# Patient Record
Sex: Male | Born: 1980 | Race: White | Hispanic: No | Marital: Married | Smoking: Never smoker
Health system: Southern US, Community
[De-identification: ages and names within clinical notes are randomized; demographics above are authoritative.]

## PROBLEM LIST (undated history)

## (undated) DIAGNOSIS — G4733 Obstructive sleep apnea (adult) (pediatric): Secondary | ICD-10-CM

## (undated) HISTORY — DX: Obstructive sleep apnea (adult) (pediatric): G47.33

---

## 2014-10-09 ENCOUNTER — Emergency Department (HOSPITAL_COMMUNITY)
Admission: EM | Admit: 2014-10-09 | Discharge: 2014-10-09 | Disposition: A | Discharge status: Home / Self Care | Attending: Emergency Medicine | Admitting: Emergency Medicine

## 2014-10-09 ENCOUNTER — Encounter (HOSPITAL_COMMUNITY): Payer: Self-pay

## 2014-10-09 DIAGNOSIS — R21 Rash and other nonspecific skin eruption: Secondary | ICD-10-CM

## 2014-10-09 MED ORDER — GOLD BOND EX POWD: Freq: Two times a day (BID) | CUTANEOUS | Status: DC

## 2014-10-09 MED ORDER — NYSTATIN-TRIAMCINOLONE 100000-0.1 UNIT/GM-% EX CREA: TOPICAL_CREAM | CUTANEOUS | Status: DC

## 2014-10-09 NOTE — ED Notes (Signed)
Pt c/o genital rash x 2 days. Denies pain, itching, and burning.  Denies discharge and GU complaints.  Pt reports a new sexual partner.

## 2014-10-09 NOTE — ED Provider Notes (Signed)
CSN: [id number]     Arrival date & time 10/09/14  1300 History   This chart was scribed for Trixie Dredge, PA-C working with Nelva Nay, MD by Elveria Rising, ED Scribe. This patient was seen in room WTR9/WTR9 and the patient's care was started at 2:25 PM.   Chief Complaint  Patient presents with  . Rash   The history is provided by the patient. No language interpreter was used.   HPI Comments: Warren Chapman is a 34 y.o. male who presents to the Emergency Department complaining of nonpruritic, nonpainful rash to suprapubic/groin which he noticed two days ago after showering. Patient suspects this is a "heat rash" describing it as similar to a rash he developed in [place]. Patient treated there with bacterial ointment; examiner contributed his symptoms to environmental irritants and compromised hygiene. Patient shares that he was prompted to be evaluated for this rash because it has not resolved. Patient reports treatment with cortisone cream, only with mild improvement. Patient denies new dermatological products or chemical exposures. Patient reports using lubricant, which he has used in the past. Patient denies burning, itching, penile discharge, testicular/scrotal pain or swelling.     History reviewed. No pertinent past medical history. History reviewed. No pertinent past surgical history. History reviewed. No pertinent family history. History  Substance Use Topics  . Smoking status: Never Smoker   . Smokeless tobacco: Not on file  . Alcohol Use: No    Review of Systems  Constitutional: Negative for fever and chills.  Gastrointestinal: Negative for abdominal pain.  Genitourinary: Negative for dysuria, discharge, penile swelling, scrotal swelling, penile pain and testicular pain.  Musculoskeletal: Negative for back pain.  Skin: Positive for rash.  Allergic/Immunologic: Negative for immunocompromised state.  Hematological: Does not bruise/bleed easily.  Psychiatric/Behavioral: Negative for  self-injury.    Allergies  Review of patient's allergies indicates no known allergies.  Home Medications   Prior to Admission medications   Not on File   Triage Vitals: BP 129/77 mmHg  Pulse 61  Temp(Src) 98.5 F (36.9 C) (Oral)  Resp 17  Ht  (1.778 m)  Wt 184 lb (83.462 kg)  BMI 26.40 kg/m2  SpO2 98% Physical Exam  Constitutional: He appears well-developed and well-nourished. No distress.  HENT:  Head: Normocephalic and atraumatic.  Neck: Neck supple.  Pulmonary/Chest: Effort normal.  Genitourinary:  Diffuse erythematous rash through suprapubic and inguinal region, and over penis. Testicles nontender, no edema, warmth or erythema. Penis circumcised. No discharge.   Neurological: He is alert.  Skin: He is not diaphoretic.  Nursing note and vitals reviewed.   ED Course  Procedures (including critical care time)  COORDINATION OF CARE: 2:34 PM- Discussed treatment plan with patient at bedside and patient agreed to plan.   Labs Review Labs Reviewed - No data to display  Imaging Review No results found.   EKG Interpretation None      MDM   Final diagnoses:  Groin rash    Afebrile, nontoxic patient with benign appearing rash of the entire groin area.  Clinically no c/w STD or cellulitis.   D/C home with gold bond, nystatin cream.  PCP follow up.  Discussed result, findings, treatment, and follow up  with patient.  Pt given return precautions.  Pt verbalizes understanding and agrees with plan.        I personally performed the services described in this documentation, which was scribed in my presence. The recorded information has been reviewed and is accurate.    Irving Burton  [place], PA-C 10/09/14 1819  Nelva Nayobert Beaton, MD 10/11/14 [phone]

## 2014-10-09 NOTE — Discharge Instructions (Signed)
Read the information below.  Use the prescribed medication as directed.  Please discuss all new medications with your pharmacist.  You may return to the Emergency Department at any time for worsening condition or any new symptoms that concern you.      Rash A rash is a change in the color or texture of your skin. There are many different types of rashes. You may have other problems that accompany your rash. CAUSES   Infections.  Allergic reactions. This can include allergies to pets or foods.  Certain medicines.  Exposure to certain chemicals, soaps, or cosmetics.  Heat.  Exposure to poisonous plants.  Tumors, both cancerous and noncancerous. SYMPTOMS   Redness.  Scaly skin.  Itchy skin.  Dry or cracked skin.  Bumps.  Blisters.  Pain. DIAGNOSIS  Your caregiver may do a physical exam to determine what type of rash you have. A skin sample (biopsy) may be taken and examined under a microscope. TREATMENT  Treatment depends on the type of rash you have. Your caregiver may prescribe certain medicines. For serious conditions, you may need to see a skin doctor (dermatologist). HOME CARE INSTRUCTIONS   Avoid the substance that caused your rash.  Do not scratch your rash. This can cause infection.  You may take cool baths to help stop itching.  Only take over-the-counter or prescription medicines as directed by your caregiver.  Keep all follow-up appointments as directed by your caregiver. SEEK IMMEDIATE MEDICAL CARE IF:  You have increasing pain, swelling, or redness.  You have a fever.  You have new or severe symptoms.  You have body aches, diarrhea, or vomiting.  Your rash is not better after 3 days. MAKE SURE YOU:  Understand these instructions.  Will watch your condition.  Will get help right away if you are not doing well or get worse. Document Released: 03/13/2002 Document Revised: 06/15/2011 Document Reviewed: 01/05/2011 [facility] Patient  Information 2015 [place], [place]. This information is not intended to replace advice given to you by your health care provider. Make sure you discuss any questions you have with your health care provider.    Emergency Department Resource Guide 1) Find a Doctor and Pay Out of Pocket Although you won't have to find out who is covered by your insurance plan, it is a good idea to ask around and get recommendations. You will then need to call the office and see if the doctor you have chosen will accept you as a new patient and what types of options they offer for patients who are self-pay. Some doctors offer discounts or will set up payment plans for their patients who do not have insurance, but you will need to ask so you aren't surprised when you get to your appointment.  2) Contact Your Local Health Department Not all health departments have doctors that can see patients for sick visits, but many do, so it is worth a call to see if yours does. If you don't know where your local health department is, you can check in your phone book. The CDC also has a tool to help you locate your state's health department, and many state websites also have listings of all of their local health departments.  3) Find a Walk-in Clinic If your illness is not likely to be very severe or complicated, you may want to try a walk in clinic. These are popping up all over the country in pharmacies, drugstores, and shopping centers. They're usually staffed by nurse practitioners or physician  assistants that have been trained to treat common illnesses and complaints. They're usually fairly quick and inexpensive. However, if you have serious medical issues or chronic medical problems, these are probably not your best option.  No Primary Care Doctor: - Call Health Connect at  [phone] - they can help you locate a primary care doctor that  accepts your insurance, provides certain services, etc. - Physician Referral Service-  [phone]  Chronic Pain Problems: Organization         Address  Phone   Notes  [facility]  [phone] Patients need to be referred by their primary care doctor.   Medication Assistance: Organization         Address  Phone   Notes  [facility] Medication [facility] [address] [place], [place] [address] [phone] --Must be a resident of [facility] [place] -- Must have NO insurance coverage whatsoever (no Medicaid/ Medicare, etc.) -- The pt. MUST have a primary care doctor that directs their care regularly and follows them in the community   MedAssist  [phone]) [phone]   [employer]  [phone]    Agencies that provide inexpensive medical care: Organization         Address  Phone   Notes  [facility]  [phone]   [facility]    [phone]   [facility] [address] [place], [place] [address] [phone]   [facility] [address], [place] [phone]   Planned Parenthood    [phone]   [facility]    [phone]   [facility]  [address], [place] Phone:  [phone], Fax:  [phone] Hours of Operation:  9 am - 6 pm, M-F.  Also accepts Medicaid/Medicare and self-pay.  [facility]  [address], [place] Phone: [phone]) [phone], Fax: [phone]. Hours of Operation:  8:30 am - 5:30 pm, M-F.  Also accepts Medicaid and self-pay.  [facility] [place] [address], [place] Phone: [phone]   [facility] [address], [place], [place] [phone], Ext. [phone] Mondays & Thursdays: 7-9 AM.  First 15 patients are seen on a first come, first serve basis.    Medicaid-accepting [facility] Providers:  Organization         Address  Phone   Notes  [facility] [address],  [place] [phone] Also accepts self-pay patients.  [facility] [address], [place]  [phone]   [facility] [address], [place] [phone]   [facility] [address] [place] [address], [place] [phone]   Renaye RakersVeita Bland [address], [place]   [phone] Only accepts [place] Access [place] patients after they have their name applied to their card.   Self-Pay (no insurance) in [facility]:  Organization         Address  Phone   Notes  Sickle Cell Patients, [facility] [address], [place] [phone]   [facility] [address], [place] [phone]   [facility]  [address],  [phone]   [facility]/Dr. Julio Sickssei-Bonsu  [address] [place]  [address], [place] or [address], [place] [phone] Phone number for both [place] and [place] locations is the same.  [facility] [address], [place] [phone]   [facility] [address], [place] or [address] [phone]   [facility] [address] [place] [phone], phone; [phone], fax Sees patients 1st and 3rd Saturday of every month.  Must not qualify for public or private insurance (i.e. Medicaid, Medicare, [place] Health Choice, Veterans' Benefits)  Household income should be no more than 200% of the poverty level The clinic cannot treat you if you are pregnant or think you are pregnant  Sexually transmitted diseases are not treated at the clinic.    Dental Care: Organization         Address  Phone  Notes  [facility] Department of [facility] [address] [place], [place] [phone] Accepts children up to age 38 who are enrolled in  [place] or [place] Health Choice; pregnant women with a Medicaid card; and children who have applied for Medicaid or [place] Health Choice, but were declined, whose parents can pay a reduced fee at time of service.  [facility] Department of [facility]  [address], [place] [phone] Accepts children up to age 10 who are enrolled in [place] or [place] Health Choice; pregnant women with a Medicaid card; and children who have applied for Medicaid or [place] Health Choice, but were declined, whose parents can pay a reduced fee at time of service.  [facility]  [address] [place], [place] [phone] Patients are seen by appointment only. Walk-ins are not accepted. [facility] will see patients 71 years of age and older. Monday - Tuesday (8am-5pm) Most Wednesdays (8:30-5pm) $30 per visit, cash only  [facility] Adult Dental Access PROGRAM  [address], [facility] [phone] Patients are seen by appointment only. Walk-ins are not accepted. [facility] will see patients 59 years of age and older. One Wednesday Evening (Monthly: Volunteer Based).  $30 per visit, cash only  [employer]  [phone] for adults; Children under age 65, call Graduate Pediatric Dentistry at [phone]. Children aged 9-14, please call [phone] to request a pediatric application.  Dental services are provided in all areas of dental care including fillings, crowns and bridges, complete and partial dentures, implants, gum treatment, root canals, and extractions. Preventive care is also provided. Treatment is provided to both adults and children. Patients are selected via a lottery and there is often a waiting list.   [facility] [address], [place]  [phone] [email or web address]   [facility] [address] [place], [place] [phone], Ext. [phone] Second and Fourth Thursday of each month, opens at 6:30  AM; Clinic ends at 9 AM.  Patients are seen on a first-come first-served basis, and a limited number are seen during each clinic.   [facility]  [address] [place], [place] [phone]   Eligibility Requirements You must have lived in [place], [place], or [place] counties for at least the last three months.   You cannot be eligible for state or federal sponsored [place], including [employer], [place], or [employer].   You generally cannot be eligible for healthcare insurance through your employer.    How to  apply: Eligibility screenings are held every Tuesday and Wednesday afternoon from 1:00 pm until 4:00 pm. You do not need an appointment for the interview!  [facility] [address], [place], [place] [phone]   [facility]  [phone]   [facility]  [phone]   [facility]  [phone]    Behavioral Health Resources in the Community: Intensive Outpatient Programs Organization         Address  Phone  Notes  [facility] [address], [place], [place] [phone]   [facility] [address], [place], [place] [phone]   [facility] [address], [place], [place]  [phone]   [facility] [address],  [place], [place] [phone] or [phone]   Substance Abuse Resources Organization         Address  Phone  Notes  [facility] Drug Services  [phone]   [facility]  [phone]   [facility]  [phone]   [facility]  [phone]   Residential & Outpatient Substance Abuse Program  [phone]   Psychological Services Organization         Address  Phone  Notes  [facility]  [phone]- [phone]   [facility]  [phone]- [phone]   [facility] [address], [place] [phone] or  [phone]    Mobile Crisis Teams Organization         Address  Phone  Notes  [facility], Mobile Crisis Care Unit  [phone]   [facility]  [address] [place], [place] [phone]   Doristine LocksSharon DeEsch [address] [place] [phone]    Self-Help/Support Groups Organization         Address  Phone             Notes  Mental Health Assoc. of  - variety of support groups  [phone] Call for more information  Narcotics Anonymous (NA), Caring [facility] [address], [place]  2 meetings at this location   Statisticianesidential Treatment Programs Organization         Address  Phone  Notes  [facility] [address],    [place]  [phone]   [facility]  [address], [place] [phone], [place], [place] [phone]   [facility] [address], [place] [phone] Admissions: 8am-3pm M-F  [facility] [address]    [place], [place] [phone]   [facility] [address], [place], [place] [phone]   [facility] [address]  [place], [place] [phone]   [facility] - Intensive Outpatient [address], [place], [place] [phone]   [facility] (Addiction Recovery [facility] [address]  [place], [place] [phone] or [phone]   [facility] (RTS) [address] [place], [place] [phone] Accepts Medicaid  [facility] [address]  [place] [phone] Substance Abuse/Addiction Treatment   [facility] Behavioral Health Resources Organization         Address  Phone  Notes  [facility]  [phone]   Angie FavaJulie Brannon, PhD [address], [address] [place], [place]   [phone](336) [phone] or [phone](336) [phone]   [facility]   [address] [place], [place] [phone]) [phone]   [facility] [address],  [place],  [place] [phone] Insurance/Medicaid/sponsorship through [facility] and Families [address], [address] [place], [place] [phone] [place], [place] [phone]    Dr. Adele Schilder  [phone]   [facility] 1) [address], [place] 2) [place] 3)  [place] [address], [place] [phone]  [phone]   [place] [phone] or [phone] (After Hours)

## 2019-03-09 ENCOUNTER — Other Ambulatory Visit: Payer: Self-pay

## 2019-03-09 DIAGNOSIS — Z20822 Contact with and (suspected) exposure to covid-19: Secondary | ICD-10-CM

## 2019-03-12 LAB — NOVEL CORONAVIRUS, NAA: SARS-CoV-2, NAA: NOT DETECTED

## 2020-04-15 ENCOUNTER — Other Ambulatory Visit: Payer: Self-pay

## 2020-04-15 ENCOUNTER — Encounter: Payer: Self-pay | Admitting: Internal Medicine

## 2020-04-15 ENCOUNTER — Ambulatory Visit: Payer: Managed Care, Other (non HMO) | Admitting: Internal Medicine

## 2020-04-15 VITALS — BP 114/70 | HR 65 | Temp 98.8°F | Ht 70.0 in | Wt 211.0 lb

## 2020-04-15 DIAGNOSIS — Z0001 Encounter for general adult medical examination with abnormal findings: Secondary | ICD-10-CM | POA: Insufficient documentation

## 2020-04-15 DIAGNOSIS — R590 Localized enlarged lymph nodes: Secondary | ICD-10-CM | POA: Insufficient documentation

## 2020-04-15 DIAGNOSIS — D696 Thrombocytopenia, unspecified: Secondary | ICD-10-CM | POA: Diagnosis not present

## 2020-04-15 DIAGNOSIS — R1011 Right upper quadrant pain: Secondary | ICD-10-CM

## 2020-04-15 LAB — HEPATIC FUNCTION PANEL
ALT: 53 U/L (ref 0–53)
AST: 22 U/L (ref 0–37)
Albumin: 4.5 g/dL (ref 3.5–5.2)
Alkaline Phosphatase: 74 U/L (ref 39–117)
Bilirubin, Direct: 0.2 mg/dL (ref 0.0–0.3)
Total Bilirubin: 0.7 mg/dL (ref 0.2–1.2)
Total Protein: 7.4 g/dL (ref 6.0–8.3)

## 2020-04-15 LAB — LIPID PANEL
Cholesterol: 131 mg/dL (ref 0–200)
HDL: 35 mg/dL — ABNORMAL LOW (ref >39.00)
LDL Cholesterol: 75 mg/dL (ref 0–99)
NonHDL: 95.91
Total CHOL/HDL Ratio: 4
Triglycerides: 104 mg/dL (ref 0.0–149.0)
VLDL: 20.8 mg/dL (ref 0.0–40.0)

## 2020-04-15 LAB — CBC WITH DIFFERENTIAL/PLATELET
Basophils Absolute: 0 10*3/uL (ref 0.0–0.1)
Basophils Relative: 0.2 % (ref 0.0–3.0)
Eosinophils Absolute: 0.1 10*3/uL (ref 0.0–0.7)
Eosinophils Relative: 2.8 % (ref 0.0–5.0)
HCT: 42.5 % (ref 39.0–52.0)
Hemoglobin: 14.7 g/dL (ref 13.0–17.0)
Lymphocytes Relative: 31.7 % (ref 12.0–46.0)
Lymphs Abs: 1.6 10*3/uL (ref 0.7–4.0)
MCHC: 34.6 g/dL (ref 30.0–36.0)
MCV: 87.4 fl (ref 78.0–100.0)
Monocytes Absolute: 0.5 10*3/uL (ref 0.1–1.0)
Monocytes Relative: 9.6 % (ref 3.0–12.0)
Neutro Abs: 2.8 10*3/uL (ref 1.4–7.7)
Neutrophils Relative %: 55.7 % (ref 43.0–77.0)
Platelets: 149 10*3/uL — ABNORMAL LOW (ref 150.0–400.0)
RBC: 4.86 Mil/uL (ref 4.22–5.81)
RDW: 13.2 % (ref 11.5–15.5)
WBC: 5 10*3/uL (ref 4.0–10.5)

## 2020-04-15 LAB — BASIC METABOLIC PANEL
BUN: 14 mg/dL (ref 6–23)
CO2: 31 mEq/L (ref 19–32)
Calcium: 9.4 mg/dL (ref 8.4–10.5)
Chloride: 105 mEq/L (ref 96–112)
Creatinine, Ser: 0.98 mg/dL (ref 0.40–1.50)
GFR: 97.9 mL/min (ref >60.00)
Glucose, Bld: 98 mg/dL (ref 70–99)
Potassium: 4.1 mEq/L (ref 3.5–5.1)
Sodium: 139 mEq/L (ref 135–145)

## 2020-04-15 LAB — URINALYSIS, ROUTINE W REFLEX MICROSCOPIC
Bilirubin Urine: NEGATIVE
Hgb urine dipstick: NEGATIVE
Ketones, ur: NEGATIVE
Leukocytes,Ua: NEGATIVE
Nitrite: NEGATIVE
RBC / HPF: NONE SEEN (ref 0–?)
Specific Gravity, Urine: 1.02 (ref 1.000–1.030)
Total Protein, Urine: NEGATIVE
Urine Glucose: NEGATIVE
Urobilinogen, UA: 0.2 (ref 0.0–1.0)
pH: 7 (ref 5.0–8.0)

## 2020-04-15 NOTE — Patient Instructions (Signed)
Lymphadenopathy  Lymphadenopathy means that your lymph glands are swollen or larger than normal. Lymph glands, also called lymph nodes, are collections of tissue that filter excess fluid, bacteria, viruses, and waste from your bloodstream. They are part of your body's disease-fighting system (immune system), which protects your body from germs. There may be different causes of lymphadenopathy, depending on where it is in your body. Some types go away on their own. Lymphadenopathy can occur anywhere that you have lymph glands, including these areas:  Neck (cervical lymphadenopathy).  Chest (mediastinal lymphadenopathy).  Lungs (hilar lymphadenopathy).  Underarms (axillary lymphadenopathy).  Groin (inguinal lymphadenopathy). When your immune system responds to germs, infection-fighting cells and fluid build up in your lymph glands. This causes some swelling and enlargement. If the lymph nodes do not go back to normal size after you have an infection or disease, your health care provider may do tests. These tests help to monitor your condition and find the reason why the glands are still swollen and enlarged. Follow these instructions at home:  Get plenty of rest.  Your health care provider may recommend over-the-counter medicines for pain. Take over-the-counter and prescription medicines only as told by your health care provider.  If directed, apply heat to swollen lymph glands as often as told by your health care provider. Use the heat source that your health care provider recommends, such as a moist heat pack or a heating pad. ? Place a towel between your skin and the heat source. ? Leave the heat on for 20-30 minutes. ? Remove the heat if your skin turns bright red. This is especially important if you are unable to feel pain, heat, or cold. You may have a greater risk of getting burned.  Check your affected lymph glands every day for changes. Check other lymph gland areas as told by your  health care provider. Check for changes such as: ? More swelling. ? Sudden increase in size. ? Redness or pain. ? Hardness.  Keep all follow-up visits. This is important.   Contact a health care provider if you have:  Lymph glands that: ? Are still swollen after 2 weeks. ? Have suddenly gotten bigger or the swelling spreads. ? Are red, painful, or hard.  Fluid leaking from the skin near an enlarged lymph gland.  Problems with breathing.  A fever, chills, or night sweats.  Fatigue.  A sore throat.  Pain in your abdomen.  Weight loss. Get help right away if you have:  Severe pain.  Chest pain.  Shortness of breath. These symptoms may represent a serious problem that is an emergency. Do not wait to see if the symptoms will go away. Get medical help right away. Call your local emergency services (911 in the [place] Do not drive yourself to the hospital. Summary  Lymphadenopathy means that your lymph glands are swollen or larger than normal.  Lymph glands, also called lymph nodes, are collections of tissue that filter excess fluid, bacteria, viruses, and waste from the bloodstream. They are part of your body's disease-fighting system (immune system).  Lymphadenopathy can occur anywhere that you have lymph glands.  If the lymph nodes do not go back to normal size after you have an infection or disease, your health care provider may do tests to monitor your condition and find the reason why the glands are still swollen and enlarged.  Check your affected lymph glands every day for changes. Check other lymph gland areas as told by your health care provider. This information   is not intended to replace advice given to you by your health care provider. Make sure you discuss any questions you have with your health care provider. Document Revised: 01/17/2020 Document Reviewed: 01/17/2020 Elsevier Patient Education  2021 Elsevier Inc.  

## 2020-04-15 NOTE — Progress Notes (Signed)
Subjective:  Patient ID: Warren Stanley., male    DOB: [birth year]  Age: 40 y.o. MRN: [id number]  CC: Abdominal Pain and Annual Exam  This visit occurred during the SARS-CoV-2 public health emergency.  Safety protocols were in place, including screening questions prior to the visit, additional usage of staff PPE, and extensive cleaning of exam room while observing appropriate contact time as indicated for disinfecting solutions.    HPI Warren Boney. presents for establishing and a CPX.  He complains of a 2-year history of intermittent, sharp right upper quadrant pain.  He controls the pain with Tylenol.  He has had significant exposures in the [employer].  He he was stationed in [place] and was exposed to burn pits.  He denies nausea, vomiting, fever, chills, loss of appetite, weight loss, dysuria, hematuria, odynophagia, or dysphagia.  History Warren Chapman has a past medical history of OSA on CPAP.   He has no past surgical history on file.   His family history includes Alcohol abuse in his father; Cervical cancer in his mother; Cirrhosis in his father.He reports that he has never smoked. He has never used smokeless tobacco. He reports that he does not drink alcohol and does not use drugs.  Outpatient Medications Prior to Visit  Medication Sig Dispense Refill  . Fexofenadine HCl (ALLEGRA ALLERGY PO) Take by mouth.    . menthol-zinc oxide (GOLD BOND) powder Apply topically 2 (two) times daily. 113 g 0  . nystatin-triamcinolone (MYCOLOG II) cream Apply to affected area daily 15 g 0   No facility-administered medications prior to visit.    ROS Review of Systems  Constitutional: Negative.  Negative for appetite change, chills, diaphoresis, fatigue and fever.  HENT: Negative.  Negative for trouble swallowing.   Eyes: Negative.   Respiratory: Negative for cough, chest tightness, shortness of breath and wheezing.   Cardiovascular: Negative for chest pain, palpitations and leg swelling.   Gastrointestinal: Positive for abdominal pain. Negative for blood in stool, constipation, diarrhea, nausea and vomiting.  Endocrine: Negative.   Genitourinary: Negative.  Negative for difficulty urinating, dysuria, hematuria, scrotal swelling and testicular pain.  Musculoskeletal: Negative for arthralgias and myalgias.  Skin: Negative.  Negative for color change and pallor.  Neurological: Negative.  Negative for dizziness, weakness and light-headedness.  Hematological: Negative for adenopathy. Does not bruise/bleed easily.  Psychiatric/Behavioral: Negative.     Objective:  BP 114/70   Pulse 65   Temp 98.8 F (37.1 C) (Oral)   Ht 5\' 10"  (1.778 m)   Wt 211 lb (95.7 kg)   SpO2 98%   BMI 30.28 kg/m   Physical Exam Vitals reviewed.  Constitutional:      Appearance: Normal appearance.  HENT:     Nose: Nose normal.     Mouth/Throat:     Mouth: Mucous membranes are moist.  Eyes:     General: No scleral icterus.    Conjunctiva/sclera: Conjunctivae normal.  Cardiovascular:     Rate and Rhythm: Normal rate and regular rhythm.     Heart sounds: No murmur heard.   Pulmonary:     Effort: Pulmonary effort is normal.     Breath sounds: No stridor. No wheezing, rhonchi or rales.  Chest:  Breasts:     Right: Axillary adenopathy present. No supraclavicular adenopathy.     Left: No axillary adenopathy or supraclavicular adenopathy.    Abdominal:     General: Abdomen is flat. Bowel sounds are normal. There is no distension.     Palpations:  Abdomen is soft. There is no hepatomegaly, splenomegaly or mass.     Tenderness: There is no abdominal tenderness.     Hernia: No hernia is present.  Musculoskeletal:        General: Normal range of motion.     Cervical back: Neck supple.     Right lower leg: No edema.     Left lower leg: No edema.  Lymphadenopathy:     Head:     Right side of head: No occipital adenopathy.     Left side of head: No occipital adenopathy.     Cervical: No  cervical adenopathy.     Right cervical: No superficial, deep or posterior cervical adenopathy.    Left cervical: No superficial, deep or posterior cervical adenopathy.     Upper Body:     Right upper body: Axillary adenopathy present. No supraclavicular, pectoral or epitrochlear adenopathy.     Left upper body: No supraclavicular, axillary, pectoral or epitrochlear adenopathy.     Lower Body: No right inguinal adenopathy. No left inguinal adenopathy.     Comments: There is a 2 cm, palpable, nontender, nonfixed lymph node in the right axillary region.  Skin:    General: Skin is warm and dry.     Findings: No rash.  Neurological:     General: No focal deficit present.     Mental Status: He is alert.  Psychiatric:        Mood and Affect: Mood normal.        Behavior: Behavior normal.     Lab Results  Component Value Date   WBC 5.0 04/15/2020   HGB 14.7 04/15/2020   HCT 42.5 04/15/2020   PLT 149.0 (L) 04/15/2020   GLUCOSE 98 04/15/2020   CHOL 131 04/15/2020   TRIG 104.0 04/15/2020   HDL 35.00 (L) 04/15/2020   LDLCALC 75 04/15/2020   ALT 53 04/15/2020   AST 22 04/15/2020   NA 139 04/15/2020   K 4.1 04/15/2020   CL 105 04/15/2020   CREATININE 0.98 04/15/2020   BUN 14 04/15/2020   CO2 31 04/15/2020    Assessment & Plan:   Warren Chapman was seen today for abdominal pain and annual exam.  Diagnoses and all orders for this visit:  LAD (lymphadenopathy), axillary -     CBC with Differential/Platelet; Future -     Basic metabolic panel; Future -     Urinalysis, Routine w reflex microscopic; Future -     Hepatic function panel; Future -     Hepatic function panel -     Urinalysis, Routine w reflex microscopic -     Basic metabolic panel -     CBC with Differential/Platelet -     CT Chest W Contrast; Future  Abdominal pain, right upper quadrant- He has had a history of significant exposure to toxins.  He has recurrent episodes of right upper quadrant abdominal pain, and he has  palpable lymphadenopathy in the right axillary region, he has a mildly low platelet count but his other labs are all within normal limits.  I recommended that he undergo a CT scan of the chest to see if there is concern for malignancy.  -     CBC with Differential/Platelet; Future -     Basic metabolic panel; Future -     Urinalysis, Routine w reflex microscopic; Future -     Hepatic function panel; Future -     Hepatic function panel -     Urinalysis,  Routine w reflex microscopic -     Basic metabolic panel -     CBC with Differential/Platelet -     CT Chest W Contrast; Future  Encounter for general adult medical examination with abnormal findings- Exam completed, labs reviewed, vaccines documented, patient education material was given. -     Lipid panel; Future -     Hepatitis C antibody; Future -     HIV Antibody (routine testing w rflx); Future -     HIV Antibody (routine testing w rflx) -     Hepatitis C antibody -     Lipid panel  Localized enlarged lymph nodes- See above. -     CT Chest W Contrast; Future  Thrombocytopenia (HCC)- See above.   I have discontinued Warren Stanley. "Warren Chapman"'s nystatin-triamcinolone and menthol-zinc oxide. I am also having him maintain his Fexofenadine HCl (ALLEGRA ALLERGY PO).  No orders of the defined types were placed in this encounter.    Follow-up: Return in about 4 weeks (around 05/13/2020).  Sanda Linger, MD

## 2020-04-16 LAB — HIV ANTIBODY (ROUTINE TESTING W REFLEX): HIV 1&2 Ab, 4th Generation: NONREACTIVE

## 2020-04-16 LAB — HEPATITIS C ANTIBODY
Hepatitis C Ab: NONREACTIVE
SIGNAL TO CUT-OFF: 0.36 (ref <1.00)

## 2020-04-17 ENCOUNTER — Encounter: Payer: Self-pay | Admitting: Internal Medicine

## 2020-04-30 ENCOUNTER — Telehealth: Payer: Self-pay | Admitting: Internal Medicine

## 2020-04-30 ENCOUNTER — Other Ambulatory Visit: Payer: Self-pay | Admitting: Internal Medicine

## 2020-04-30 DIAGNOSIS — R590 Localized enlarged lymph nodes: Secondary | ICD-10-CM

## 2020-04-30 DIAGNOSIS — D696 Thrombocytopenia, unspecified: Secondary | ICD-10-CM

## 2020-04-30 NOTE — Telephone Encounter (Signed)
Pt has been informed.

## 2020-04-30 NOTE — Telephone Encounter (Signed)
Spoke to pt and he asked about Dr. Yetta Barre wanting him to follow up on some lab work.  No labs have been put in.  Can you please call pt regarding this? Thanks

## 2020-04-30 NOTE — Telephone Encounter (Signed)
Labs ordered.

## 2020-05-03 ENCOUNTER — Other Ambulatory Visit: Payer: Self-pay | Admitting: Internal Medicine

## 2020-05-03 DIAGNOSIS — R590 Localized enlarged lymph nodes: Secondary | ICD-10-CM

## 2020-05-23 ENCOUNTER — Encounter: Payer: Self-pay | Admitting: Internal Medicine

## 2020-07-08 ENCOUNTER — Other Ambulatory Visit: Payer: Self-pay

## 2020-07-08 ENCOUNTER — Ambulatory Visit
Admission: RE | Admit: 2020-07-08 | Discharge: 2020-07-08 | Disposition: A | Discharge status: Home / Self Care | Payer: Managed Care, Other (non HMO) | Source: Ambulatory Visit | Attending: Internal Medicine | Admitting: Internal Medicine

## 2020-07-08 DIAGNOSIS — R590 Localized enlarged lymph nodes: Secondary | ICD-10-CM

## 2020-09-13 ENCOUNTER — Encounter: Payer: Self-pay | Admitting: Internal Medicine

## 2020-09-16 ENCOUNTER — Telehealth (INDEPENDENT_AMBULATORY_CARE_PROVIDER_SITE_OTHER): Payer: Managed Care, Other (non HMO) | Admitting: Internal Medicine

## 2020-09-16 ENCOUNTER — Encounter: Payer: Self-pay | Admitting: Internal Medicine

## 2020-09-16 DIAGNOSIS — J069 Acute upper respiratory infection, unspecified: Secondary | ICD-10-CM | POA: Diagnosis not present

## 2020-09-16 NOTE — Assessment & Plan Note (Signed)
Do a COVID test today A work note for  6/10, 09/14/20 Use OTC cold meds prn

## 2020-09-16 NOTE — Progress Notes (Signed)
Virtual Visit via Video Note  I connected with Warren Chapman. on 09/16/20 at  3:20 PM EDT by a video enabled telemedicine application and verified that I am speaking with the correct person using two identifiers.  Location: Patient: home Provider: [facility] office No one else is present   I discussed the limitations of evaluation and management by telemedicine and the availability of in person appointments. The patient expressed understanding and agreed to proceed.  History of Present Illness:   C/o cold like sx's since Thur-Fri: congestion, fatigue, achy. Taking OTC cold meds - feeling better...  Observations/Objective:  Pt sounds normal, NAD. Assessment and Plan:   Follow Up Instructions:    I discussed the assessment and treatment plan with the patient. The patient was provided an opportunity to ask questions and all were answered. The patient agreed with the plan and demonstrated an understanding of the instructions.   The patient was advised to call back or seek an in-person evaluation if the symptoms worsen or if the condition fails to improve as anticipated.  I provided 12 minutes of non-face-to-face time during this encounter.   Sonda Primes, MD

## 2021-02-03 ENCOUNTER — Ambulatory Visit (INDEPENDENT_AMBULATORY_CARE_PROVIDER_SITE_OTHER): Payer: Managed Care, Other (non HMO)

## 2021-02-03 ENCOUNTER — Other Ambulatory Visit: Payer: Self-pay

## 2021-02-03 DIAGNOSIS — Z23 Encounter for immunization: Secondary | ICD-10-CM | POA: Diagnosis not present

## 2021-02-03 NOTE — Progress Notes (Signed)
Pt was given reg flu vacc w/o any complications. 

## 2021-02-06 NOTE — Progress Notes (Signed)
    Subjective:    CC: R shoulder pain  I, Molly Weber, LAT, ATC, am serving as scribe for Dr. Clementeen Graham.  HPI: Pt is a 40 y/o male presenting w/ c/o R shoulder pain x several months w/ no known MOI.  He locates his pain to his R lateral shoulder.  He works in [employer] and is in the [employer].  He does recall his R shoulder "giving out" in 2015 when doing push-ups during a PT test but otherwise recalls no specific R shoulder injury.  Radiating pain: no R shoulder mechanical symptoms: intermittently  Aggravating factors: throwing a baseball or football; lifting something heavy overhead;  Treatments tried: Tylenol arthritis    Pertinent review of Systems: no fever or chills  Relevant historical information: thrombocytopenia hx   Objective:    Vitals:   02/07/21 0759  BP: 110/80  Pulse: 68  SpO2: 98%   General: Well Developed, well nourished, and in no acute distress.   MSK: Right shoulder: Normal-appearing Normal motion. Normal strength. Mildly positive Hawkins and Neer's test. Mildly positive empty can test. Negative Yergason's and speeds test. Negative O'Brien's test. Slight palpable click with abduction and external rotation. Negative clunk test.   Lab and Radiology Results  Diagnostic Limited MSK Ultrasound of: Right shoulder Biceps tendon intact and normal-appearing Subscapularis tendon normal-appearing Supraspinatus tendon intact without visible tear. Mild subacromial tightness present. Infraspinatus tendon normal. AC joint normal-appearing Impression: Subacromial bursitis mild  X-ray images right shoulder obtained today personally and independently interpreted No acute fractures.  Normal-appearing shoulder Await formal radiology review   Impression and Recommendations:    Assessment and Plan: 40 y.o. male with right shoulder pain present with overhead heavy lifting and with dynamic activities such as overhead throwing.  Pain thought to  be due to rotator cuff impingement with some bursitis and may be rotator cuff tendinopathy.  Discussed treatment plan and options..  Plan to treat with home exercise program taught in clinic today by ATC.  Patient would do better with formal physical therapy but he would like to try it on his own first.  Check back in 6 weeks.  If not improved consider PT, injection, or MRI.  PDMP not reviewed this encounter. Orders Placed This Encounter  Procedures   [place] LIMITED JOINT SPACE STRUCTURES UP RIGHT(NO LINKED CHARGES)    Order Specific Question:   Reason for Exam (SYMPTOM  OR DIAGNOSIS REQUIRED)    Answer:   R shoulder pain    Order Specific Question:   Preferred imaging location?    Answer:   [facility] Right    Standing Status:   Future    Number of Occurrences:   1    Standing Expiration Date:   03/08/2021    Order Specific Question:   Reason for Exam (SYMPTOM  OR DIAGNOSIS REQUIRED)    Answer:   R shoulder pain    Order Specific Question:   Preferred imaging location?    Answer:   [facility]   No orders of the defined types were placed in this encounter.   Discussed warning signs or symptoms. Please see discharge instructions. Patient expresses understanding.   The above documentation has been reviewed and is accurate and complete Clementeen Graham, M.D.

## 2021-02-07 ENCOUNTER — Other Ambulatory Visit: Payer: Self-pay

## 2021-02-07 ENCOUNTER — Ambulatory Visit: Payer: Managed Care, Other (non HMO) | Admitting: Family Medicine

## 2021-02-07 ENCOUNTER — Ambulatory Visit: Payer: Self-pay

## 2021-02-07 ENCOUNTER — Ambulatory Visit (INDEPENDENT_AMBULATORY_CARE_PROVIDER_SITE_OTHER): Payer: Managed Care, Other (non HMO)

## 2021-02-07 ENCOUNTER — Encounter: Payer: Self-pay | Admitting: Family Medicine

## 2021-02-07 VITALS — BP 110/80 | HR 68 | Ht 70.0 in | Wt 208.8 lb

## 2021-02-07 DIAGNOSIS — G8929 Other chronic pain: Secondary | ICD-10-CM

## 2021-02-07 DIAGNOSIS — M25511 Pain in right shoulder: Secondary | ICD-10-CM | POA: Diagnosis not present

## 2021-02-07 NOTE — Patient Instructions (Addendum)
Nice to meet you today.  Please perform the exercise program that we have prepared for you and gone over in detail on a daily basis.  In addition to the handout you were provided you can access your program through: [email or web address]   Your unique program code is:   [email or web address]  Follow-up: 6 weeks

## 2021-02-08 NOTE — Progress Notes (Signed)
Right shoulder x-ray looks normal to radiology

## 2021-03-19 NOTE — Progress Notes (Signed)
° °  I, Christoper Fabian, LAT, ATC, am serving as scribe for Dr. Clementeen Graham.  Warren Chapman. is a 40 y.o. male who presents to [facility] today for f/u of R lateral shoulder pain  thought to be due to rotator cuff impingement with some bursitis.  He was last seen by Dr. Denyse Amass on 02/07/21 and was provided a HEP focusing on RC and periscapular strengthening.  Today, pt reports that his L shoulder isn't hurting currently.  He has been lifting but has not been doing his HEP.  He feels better.   Diagnostic testing: R shoulder XR- 02/07/21  Pertinent review of systems: No fevers or chills  Relevant historical information: Thrombocytopenia   Exam:  BP 102/72 (BP Location: Left Arm, Patient Position: Sitting, Cuff Size: Normal)    Pulse 64    Ht 5\' 10"  (1.778 m)    Wt 214 lb (97.1 kg)    SpO2 97%    BMI 30.71 kg/m  General: Well Developed, well nourished, and in no acute distress.   MSK: Right shoulder normal-appearing normal motion normal strength negative impingement testing.    Lab and Radiology Results EXAM: RIGHT SHOULDER - 2+ VIEW   COMPARISON:  None.   FINDINGS: There is no evidence of fracture or dislocation. There is no evidence of arthropathy or other focal bone abnormality. Soft tissues are unremarkable.   IMPRESSION: No radiographic abnormality is seen in right shoulder.     Electronically Signed   By: M.D.   On: 02/07/2021 15:54 I, 13/07/2020, personally (independently) visualized and performed the interpretation of the images attached in this note.     Assessment and Plan: 40 y.o. male with right shoulder pain due to impingement.  Significant improvement with home exercise program.  Plan to continue home exercise program and recheck back as needed.  Certainly if symptoms return can order physical therapy or return to clinic for injection or even MRI.  Check back as needed   Discussed warning signs or symptoms.  Please see discharge instructions. Patient expresses understanding.   The above documentation has been reviewed and is accurate and complete 24, M.D.

## 2021-03-20 ENCOUNTER — Other Ambulatory Visit: Payer: Self-pay

## 2021-03-20 ENCOUNTER — Encounter: Payer: Self-pay | Admitting: Family Medicine

## 2021-03-20 ENCOUNTER — Ambulatory Visit (INDEPENDENT_AMBULATORY_CARE_PROVIDER_SITE_OTHER): Payer: Managed Care, Other (non HMO) | Admitting: Family Medicine

## 2021-03-20 VITALS — BP 102/72 | HR 64 | Ht 70.0 in | Wt 214.0 lb

## 2021-03-20 DIAGNOSIS — G8929 Other chronic pain: Secondary | ICD-10-CM

## 2021-03-20 DIAGNOSIS — M25511 Pain in right shoulder: Secondary | ICD-10-CM | POA: Diagnosis not present

## 2021-03-20 NOTE — Patient Instructions (Signed)
Good to see you today.  Follow-up as needed.  Happy Holidays! 

## 2022-12-27 IMAGING — US US AXILLARY RIGHT
1 series · 5 of 5 positions shown · non-contrast
Comparison: None.

CLINICAL DATA: 39-year-old male presenting with a possible palpable
lump in the RIGHT axilla on clinical examination. The patient had
his DAINR-WE booster in the RIGHT arm in April 2020.

EXAM:
ULTRASOUND OF THE RIGHT AXILLA

[Series 1: us axillary right · 0.07mm/px · 5 of 5 slices shown]
[im 1/5]
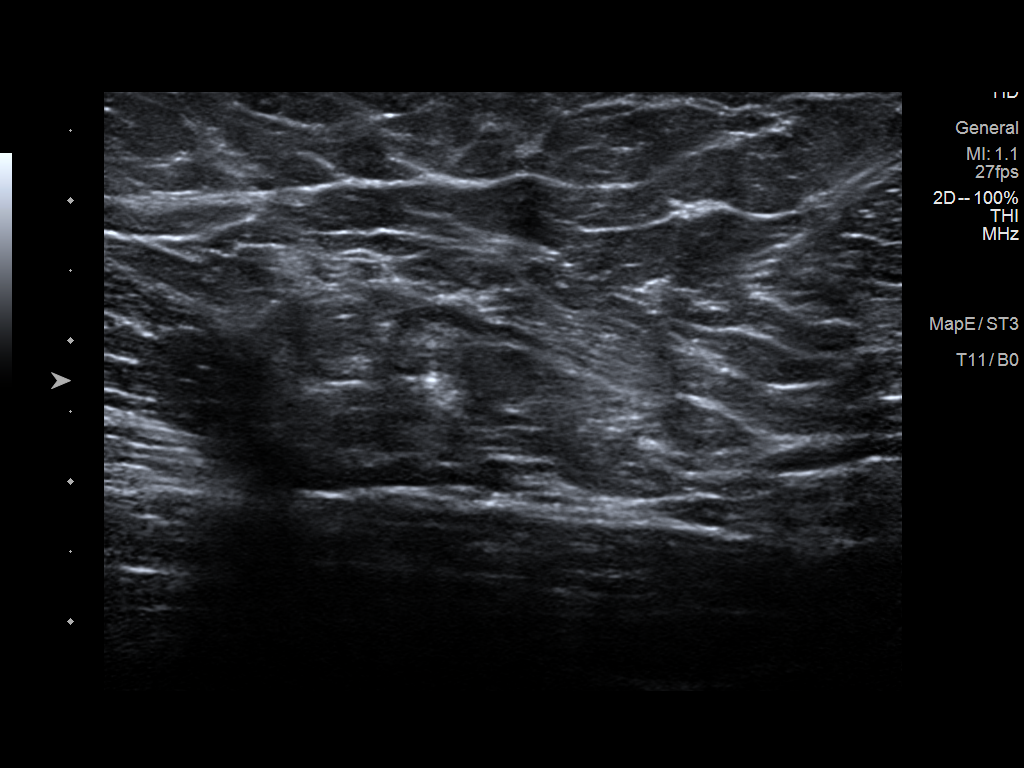
[im 2/5]
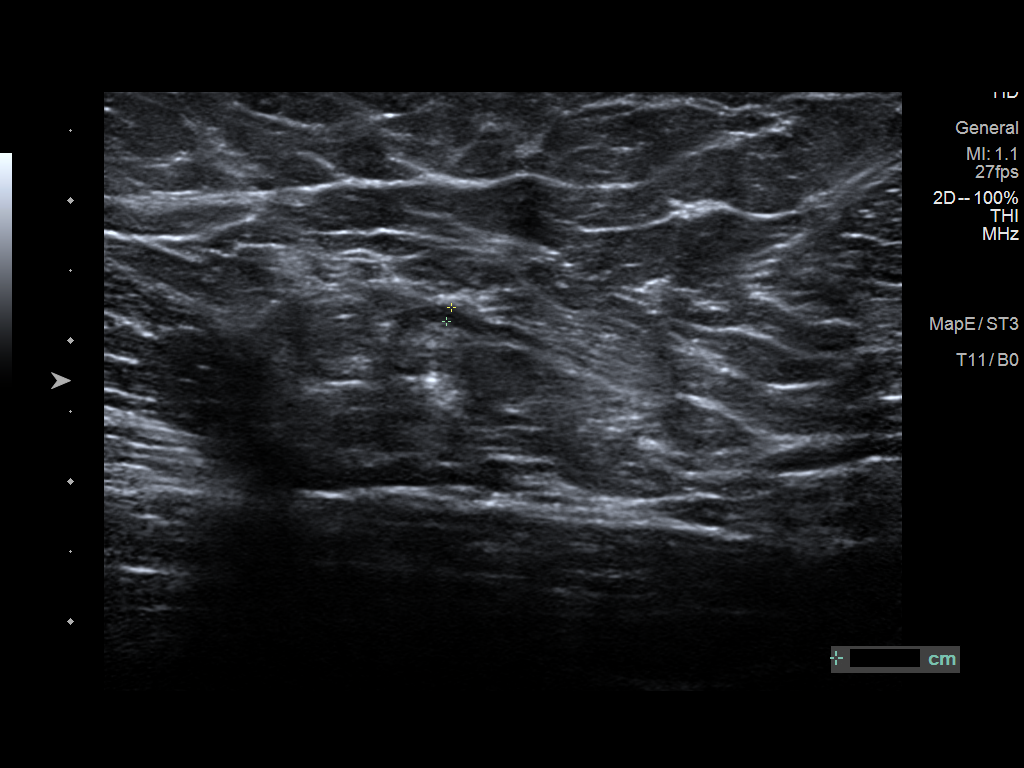
[im 3/5]
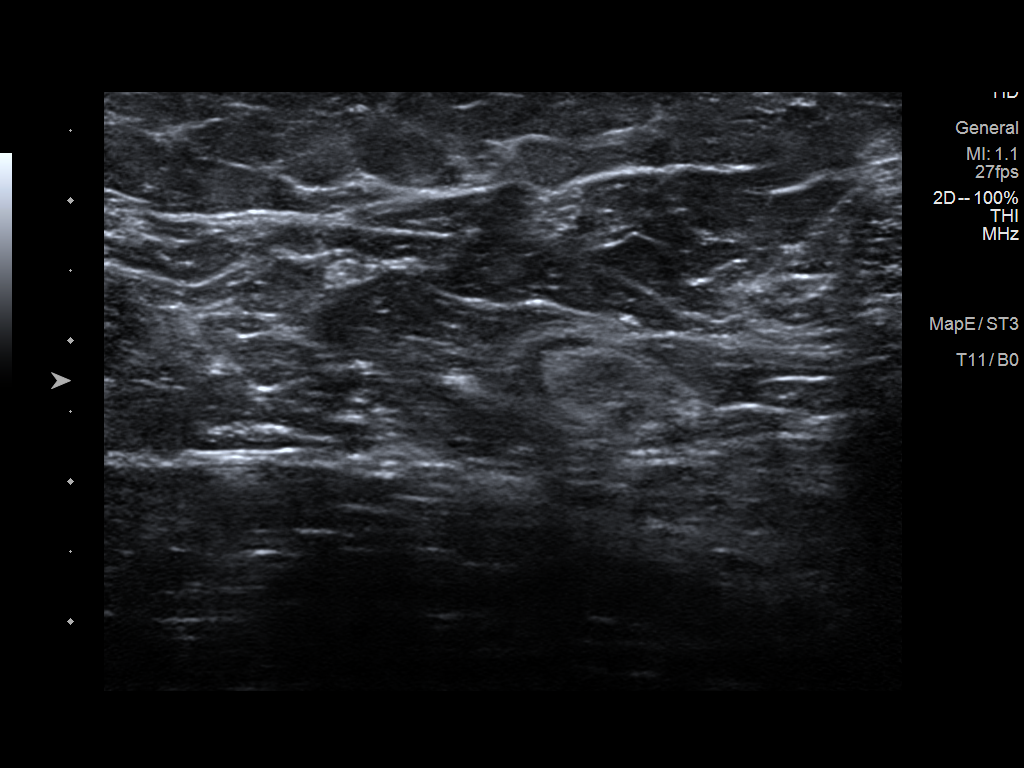
[im 4/5]
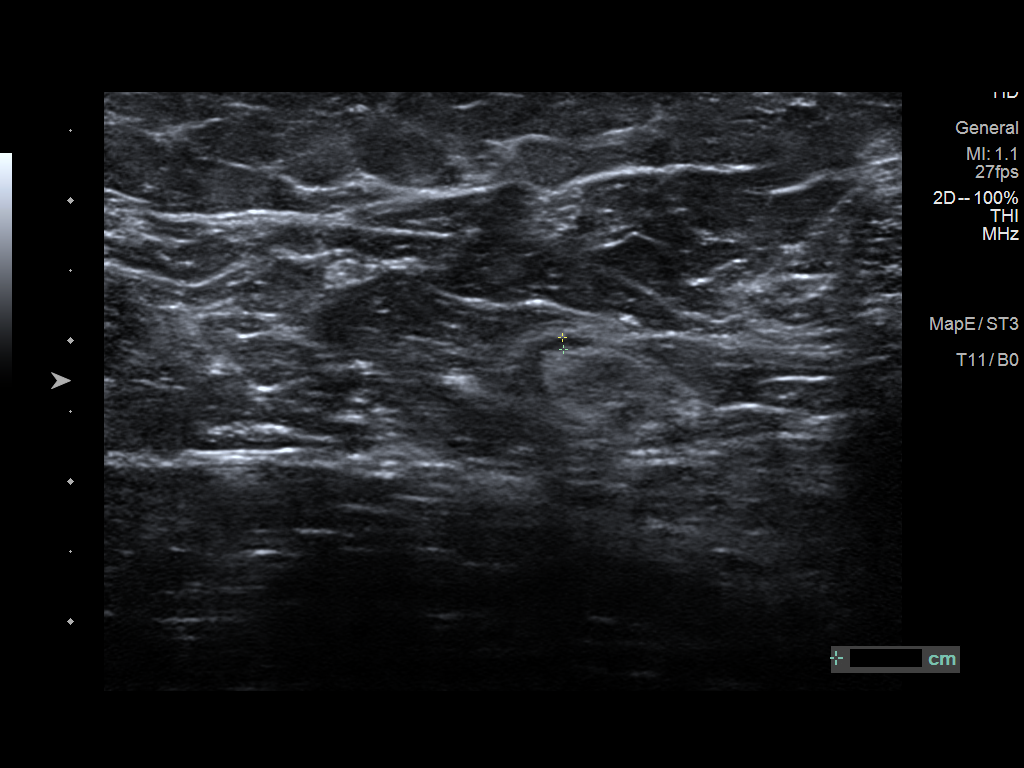
[im 5/5]
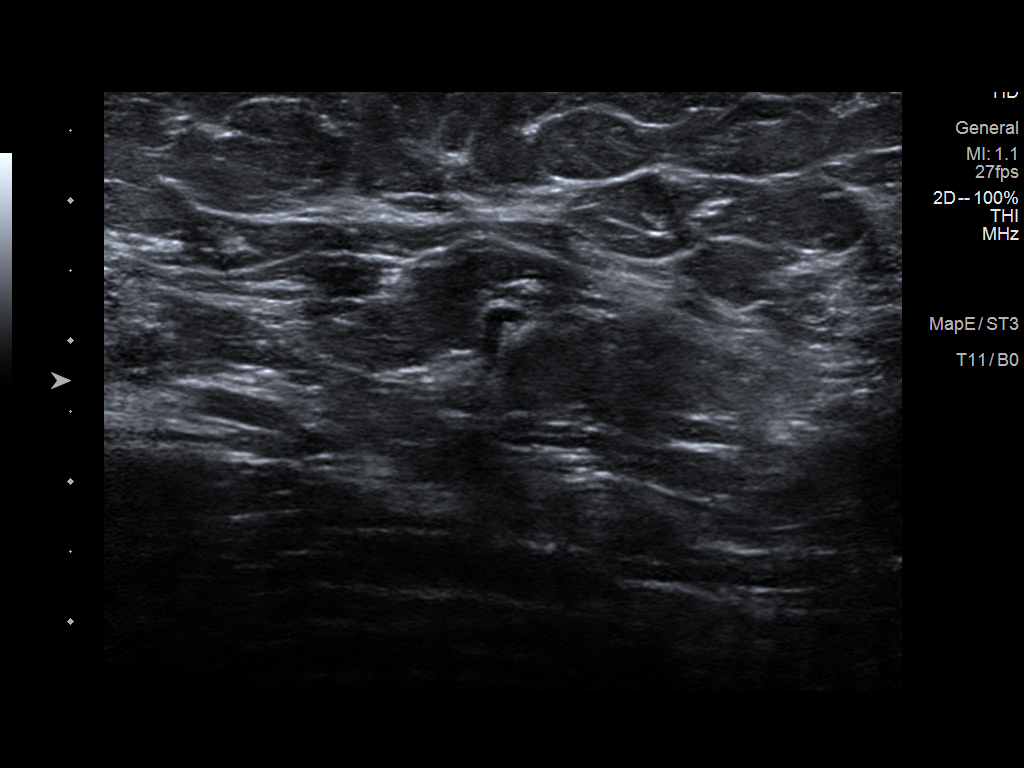

[5 of 5 positions shown; findings below may reference images not displayed]

FINDINGS: On correlative physical exam, I do not palpate a discrete mass in
the RIGHT axilla.

Ultrasound is performed, showing normal appearing lymph nodes in the
RIGHT axilla. There is no mass or pathologic lymphadenopathy.
IMPRESSION: Normal examination.

RECOMMENDATION:
No further imaging follow-up is felt necessary unless there are
persistent or subsequent clinical concerns.

I have discussed the findings and recommendations with the patient.

BI-RADS CATEGORY  1: Negative.

## 2023-07-29 IMAGING — DX DG SHOULDER 2+V*R*
3 series · 3 of 3 positions shown · non-contrast
Comparison: None.

CLINICAL DATA: Right shoulder pain

EXAM:
RIGHT SHOULDER - 2+ VIEW

[shoulder ap (1 of 2)]
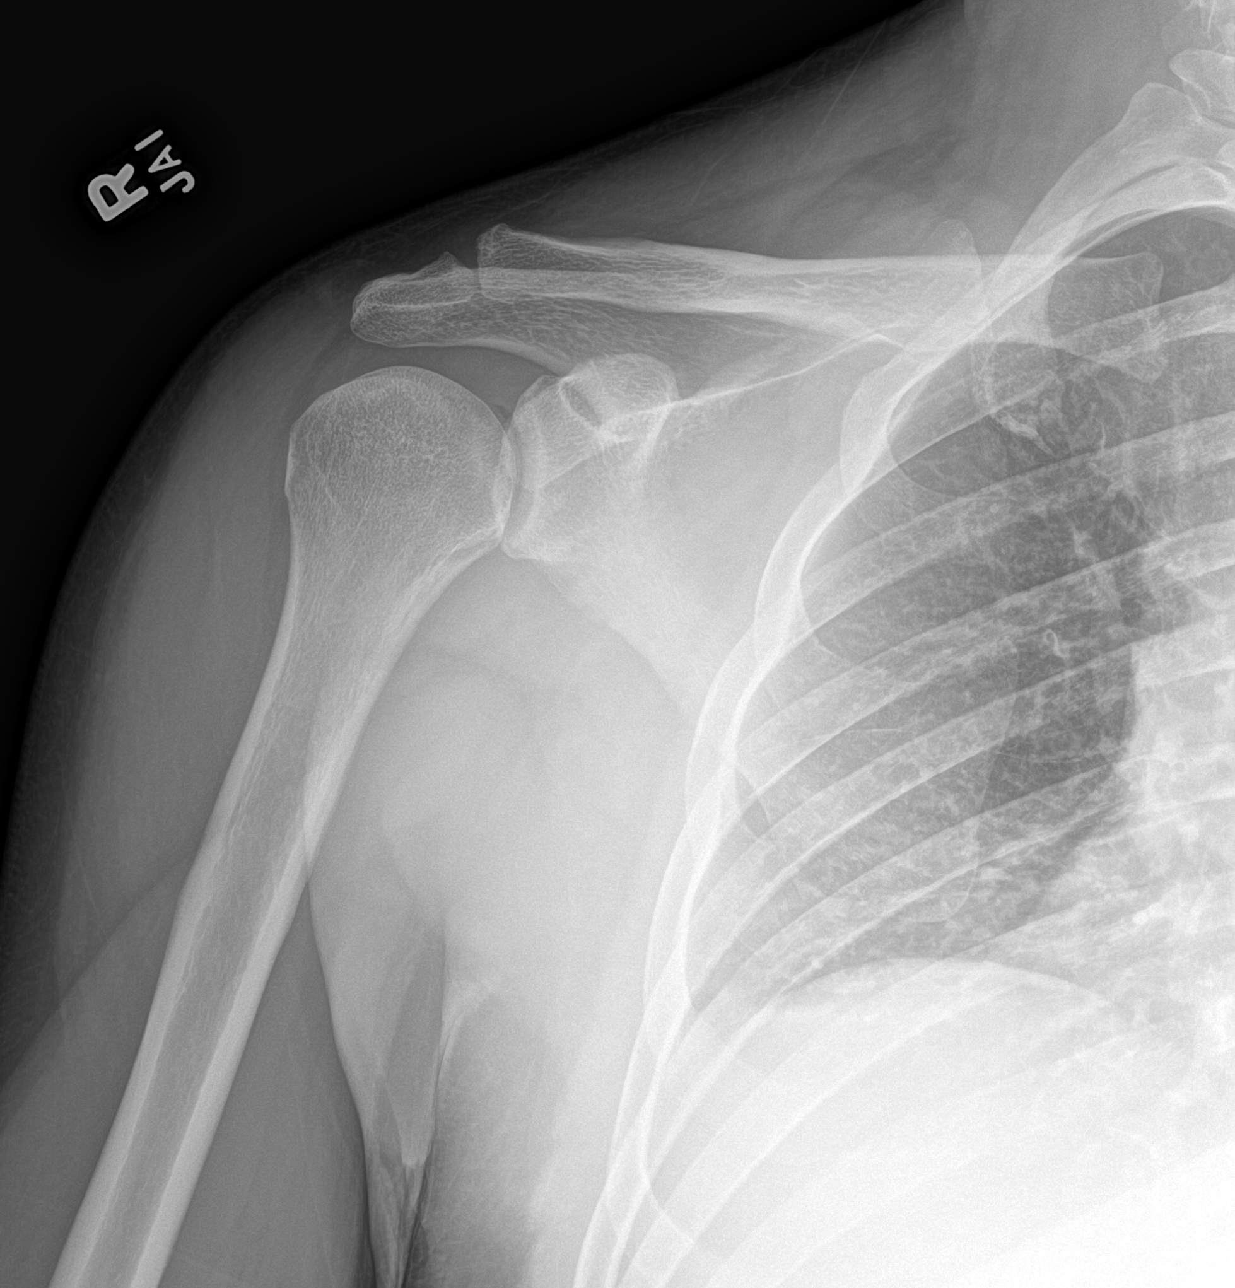

[shoulder ap (2 of 2)]
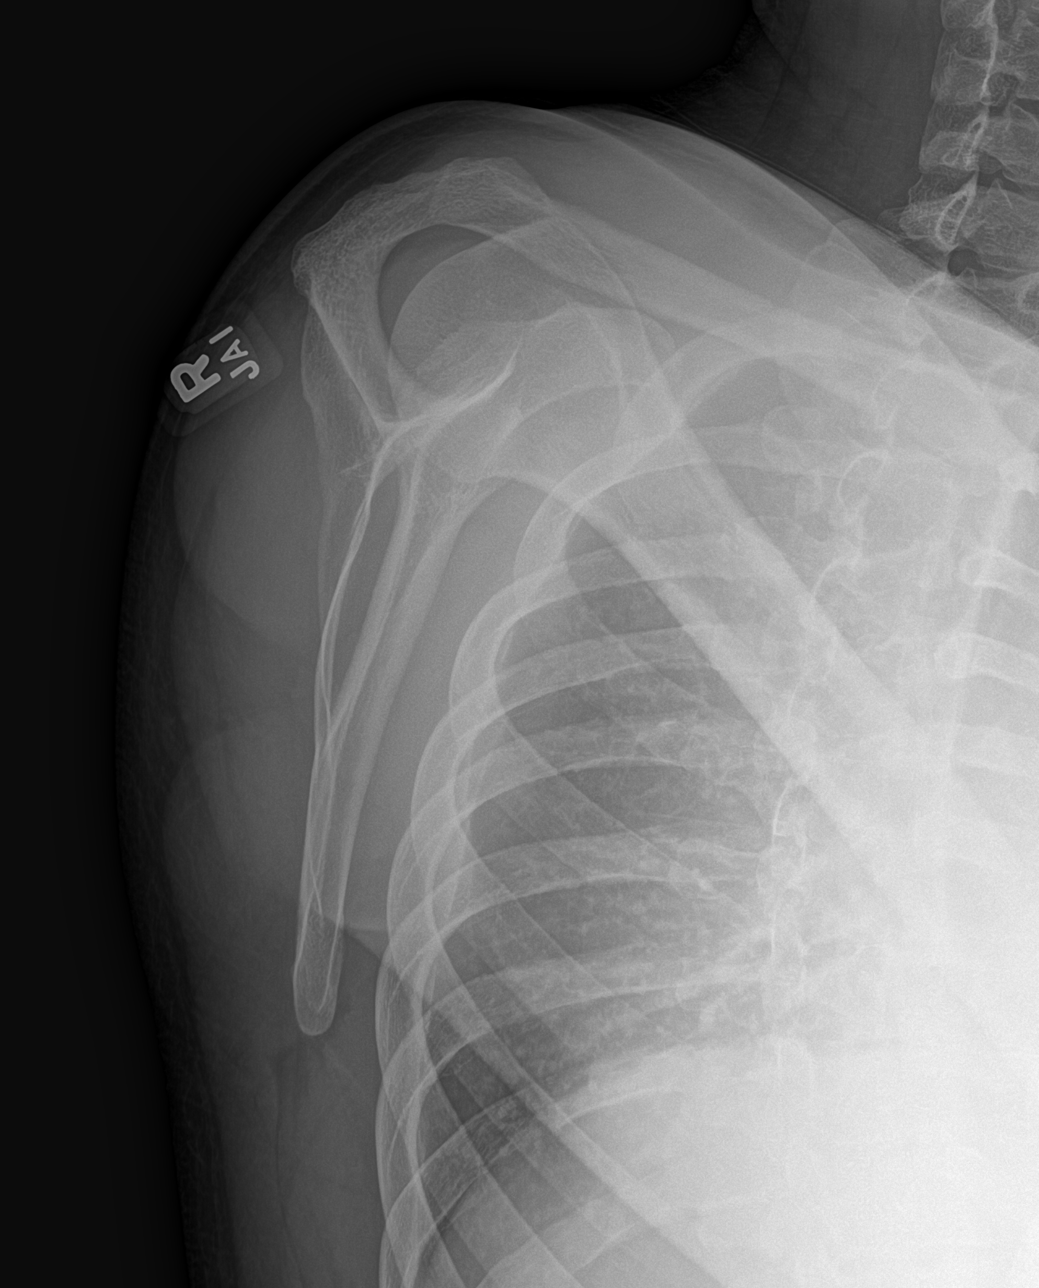

[shoulder axial]
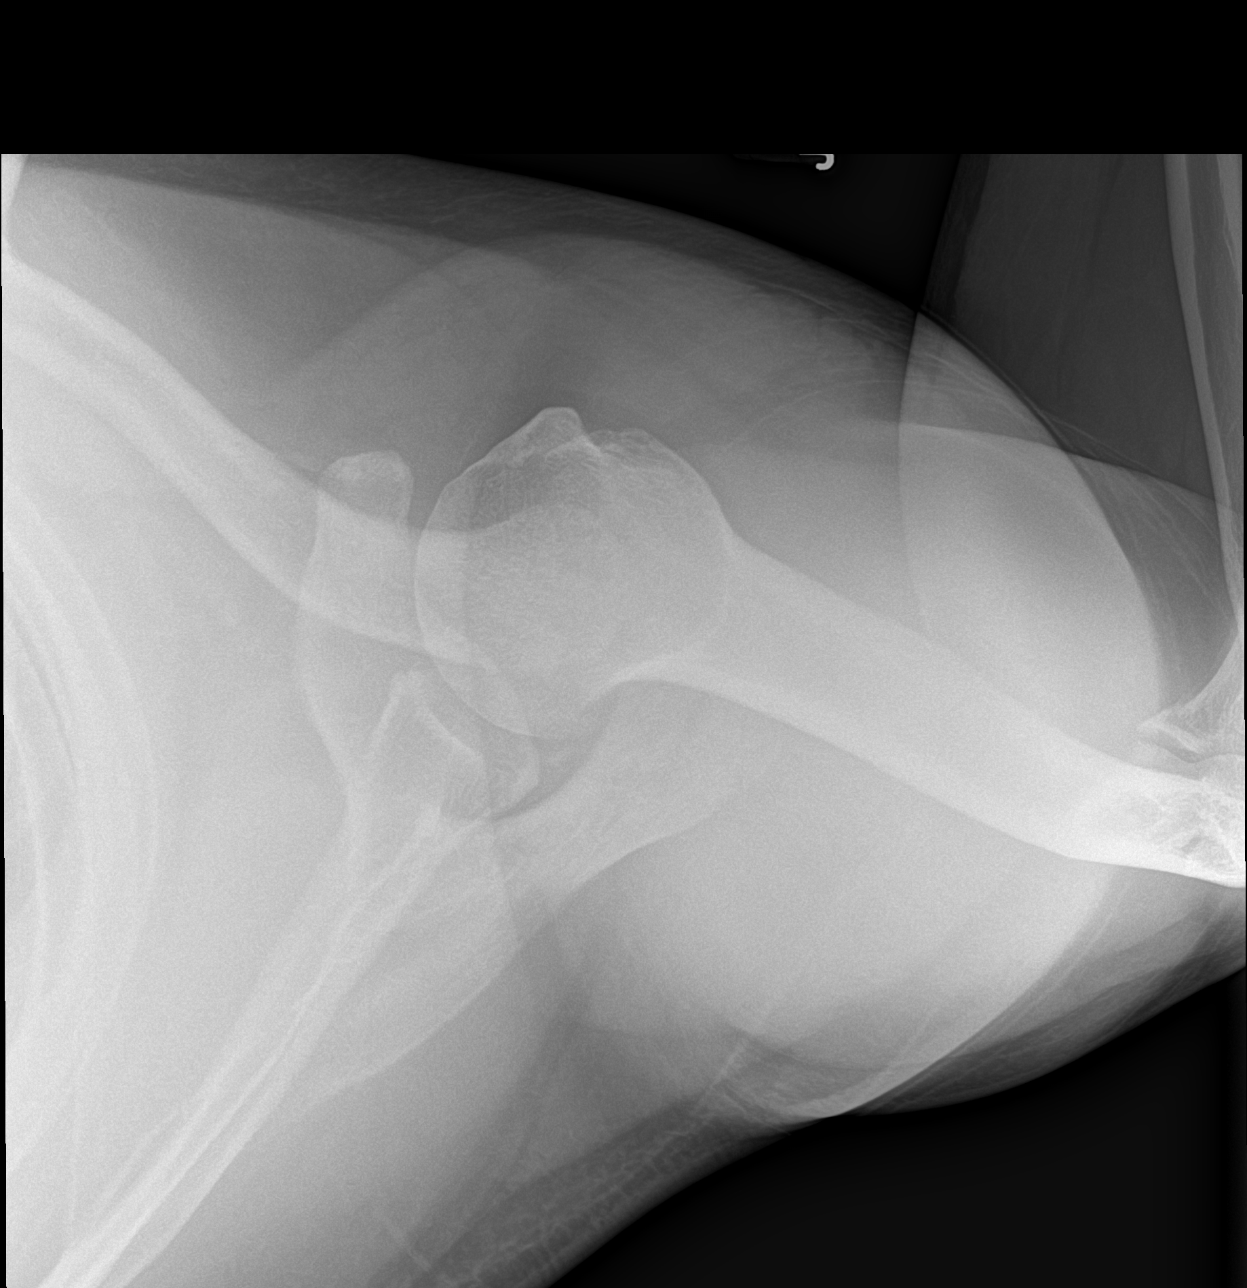

[3 of 3 positions shown; findings below may reference images not displayed]

FINDINGS: There is no evidence of fracture or dislocation. There is no
evidence of arthropathy or other focal bone abnormality. Soft
tissues are unremarkable.
IMPRESSION: No radiographic abnormality is seen in right shoulder.
